# Patient Record
Sex: Female | Born: 1970 | Race: Black or African American | Hispanic: No | Marital: Single | Smoking: Never smoker
Health system: Southern US, Community
[De-identification: ages and names within clinical notes are randomized; demographics above are authoritative.]

## PROBLEM LIST (undated history)

## (undated) DIAGNOSIS — J189 Pneumonia, unspecified organism: Secondary | ICD-10-CM

## (undated) DIAGNOSIS — F319 Bipolar disorder, unspecified: Secondary | ICD-10-CM

## (undated) DIAGNOSIS — F4310 Post-traumatic stress disorder, unspecified: Secondary | ICD-10-CM

## (undated) DIAGNOSIS — U071 COVID-19: Secondary | ICD-10-CM

## (undated) DIAGNOSIS — F419 Anxiety disorder, unspecified: Secondary | ICD-10-CM

## (undated) HISTORY — PX: TONSILLECTOMY: SUR1361

---

## 2005-02-05 ENCOUNTER — Emergency Department: Payer: Self-pay | Admitting: Unknown Physician Specialty

## 2005-04-16 ENCOUNTER — Emergency Department: Payer: Self-pay | Admitting: Unknown Physician Specialty

## 2005-07-21 ENCOUNTER — Emergency Department: Payer: Self-pay | Admitting: Emergency Medicine

## 2008-02-09 ENCOUNTER — Inpatient Hospital Stay: Payer: Self-pay | Admitting: Psychiatry

## 2008-12-31 ENCOUNTER — Emergency Department: Payer: Self-pay | Admitting: Emergency Medicine

## 2009-06-06 ENCOUNTER — Emergency Department: Payer: Self-pay | Admitting: Emergency Medicine

## 2009-07-09 ENCOUNTER — Emergency Department: Payer: Self-pay | Admitting: Emergency Medicine

## 2009-10-01 ENCOUNTER — Inpatient Hospital Stay: Payer: Self-pay | Admitting: Psychiatry

## 2009-11-18 ENCOUNTER — Emergency Department: Payer: Self-pay | Admitting: Internal Medicine

## 2009-12-13 ENCOUNTER — Emergency Department: Payer: Self-pay | Admitting: Emergency Medicine

## 2010-01-20 ENCOUNTER — Emergency Department: Payer: Self-pay | Admitting: Emergency Medicine

## 2010-01-25 ENCOUNTER — Emergency Department: Payer: Self-pay | Admitting: Emergency Medicine

## 2010-06-05 ENCOUNTER — Emergency Department: Payer: Self-pay | Admitting: Emergency Medicine

## 2010-06-19 ENCOUNTER — Ambulatory Visit: Payer: Self-pay | Admitting: Unknown Physician Specialty

## 2010-06-24 ENCOUNTER — Inpatient Hospital Stay: Payer: Self-pay | Admitting: Internal Medicine

## 2010-08-20 ENCOUNTER — Emergency Department: Payer: Self-pay | Admitting: Emergency Medicine

## 2010-10-14 ENCOUNTER — Ambulatory Visit: Payer: Self-pay | Admitting: Family Medicine

## 2011-08-14 IMAGING — CT CT CHEST W/ CM
2 series · 15 of 31 positions shown, 19 images · non-contrast
Comparison: none

REASON FOR EXAM: hypoxia, tachycardia, chest pain, elevated d-dimer, ?PE
COMMENTS:

PROCEDURE:     CT  - CT CHEST (FOR PE) W  - June 24, 2010  [DATE]
RESULT:     Comparison: Chest radiograph performed same day.
TECHNIQUE: Multiple axial images were obtained of the chest, after the
administration of 85 mL Ssovue-U8Q, according to the PE protocol.

[Series 4: soft tissue · axial · 0.62mm/px · z∈[-1092,-1046]mm · 2 of 97 slices shown]
[im 8/97  mediastinal]
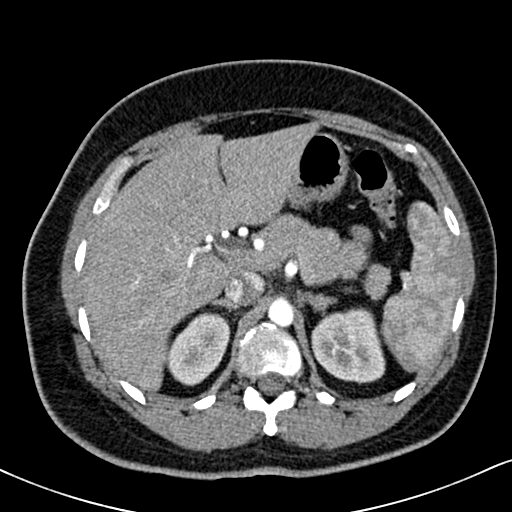
[im 23/97  mediastinal]
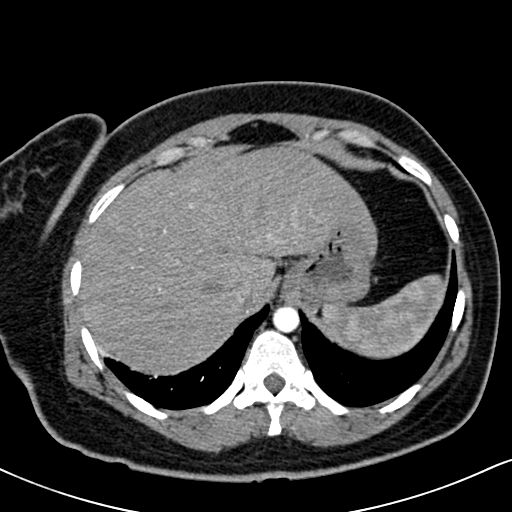

[Series 5: lung windows · axial · 0.62mm/px · z∈[-1082,-848]mm · 13 of 94 slices shown, 17 images]
[im 8/94  mediastinal]
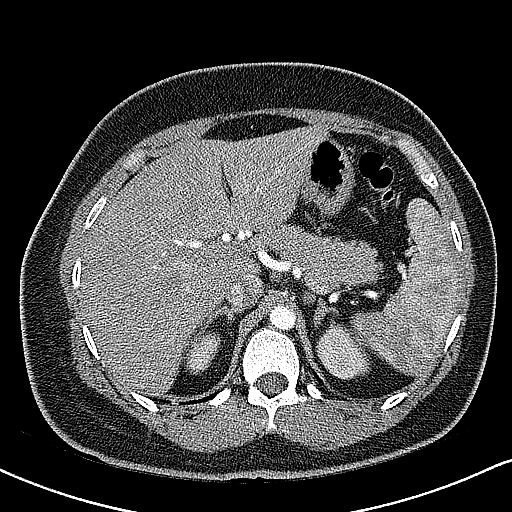
[im 8/94  lung]
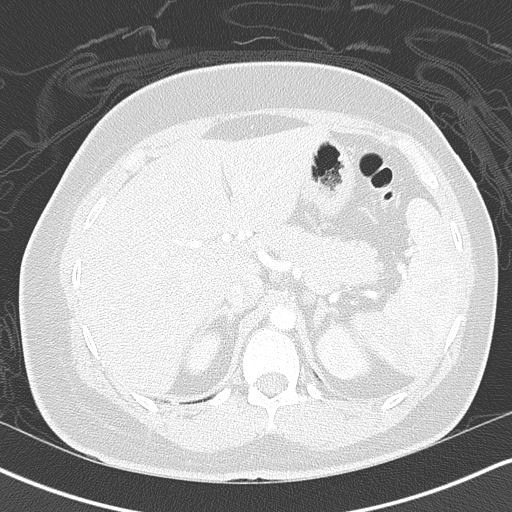
[im 15/94  lung]
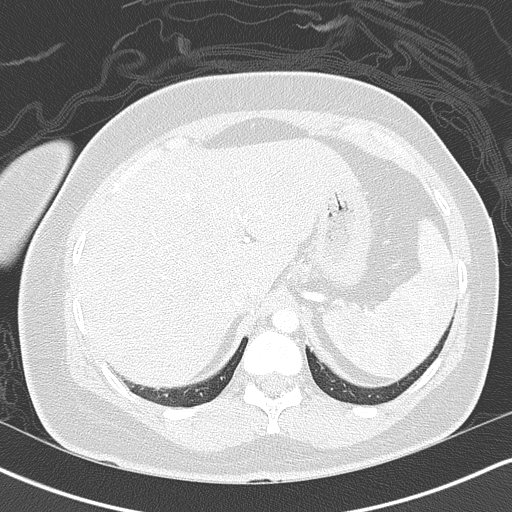
[im 22/94  lung]
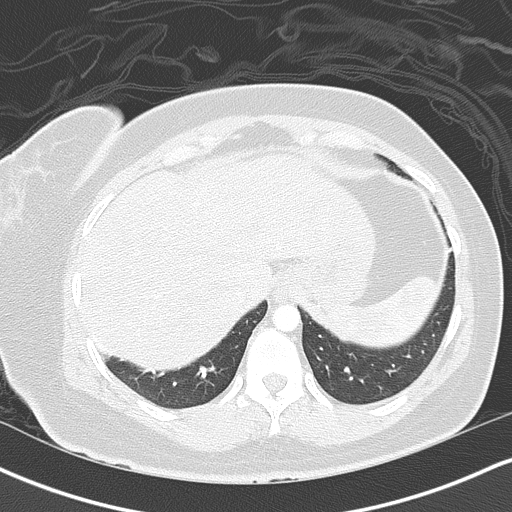
[im 29/94  lung]
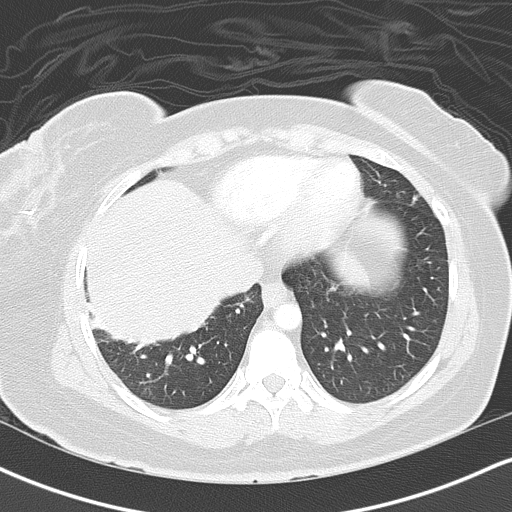
[im 36/94  mediastinal]
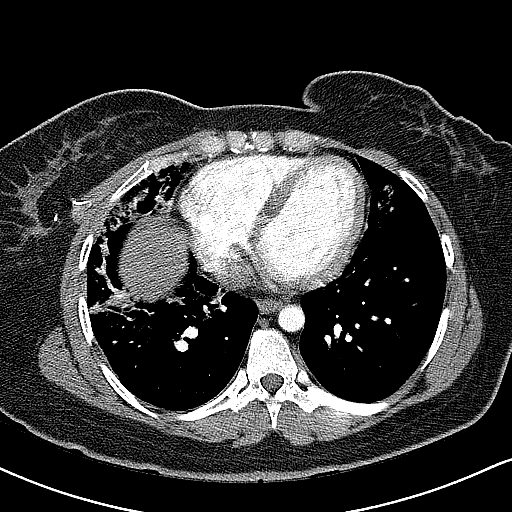
[im 36/94  lung]
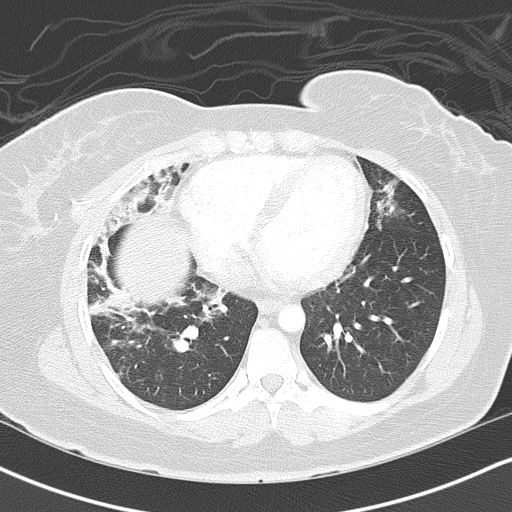
[im 43/94  lung]
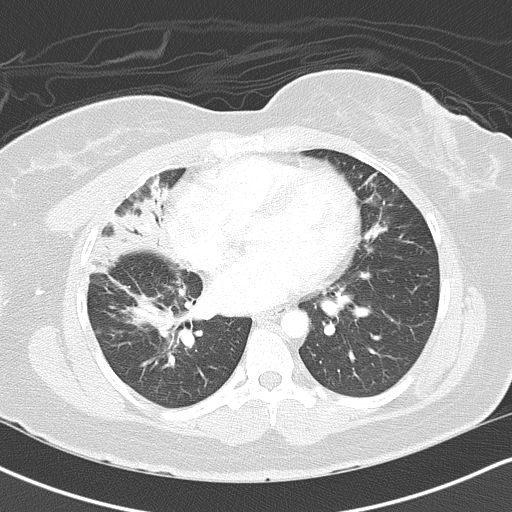
[im 47/94  lung]
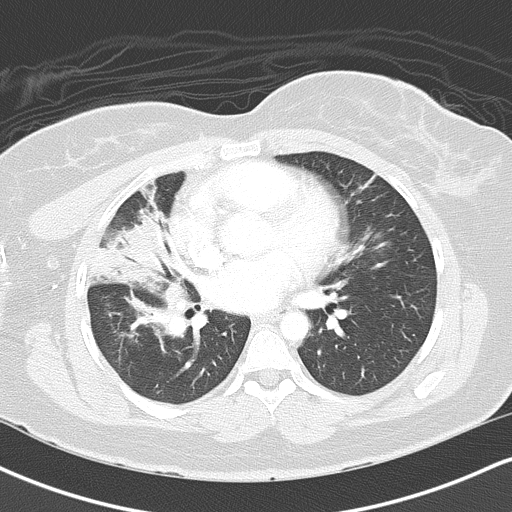
[im 51/94  lung]
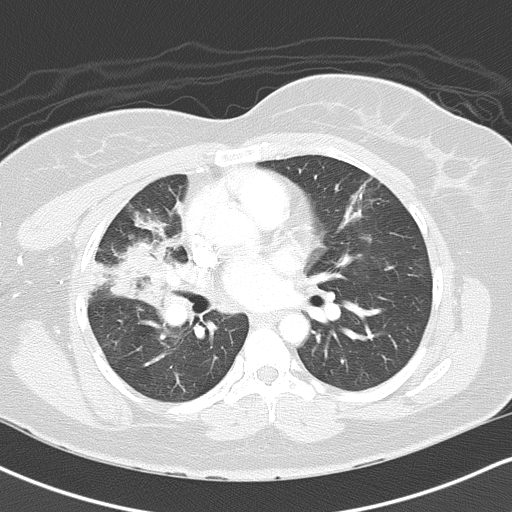
[im 58/94  mediastinal]
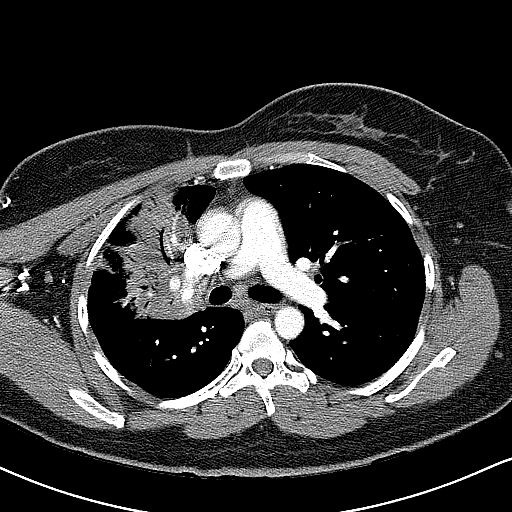
[im 58/94  lung]
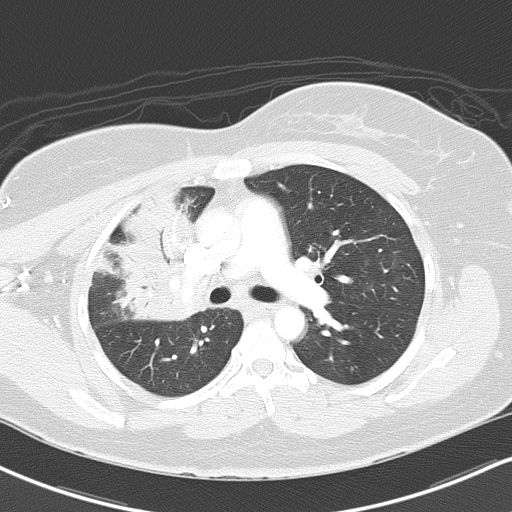
[im 65/94  lung]
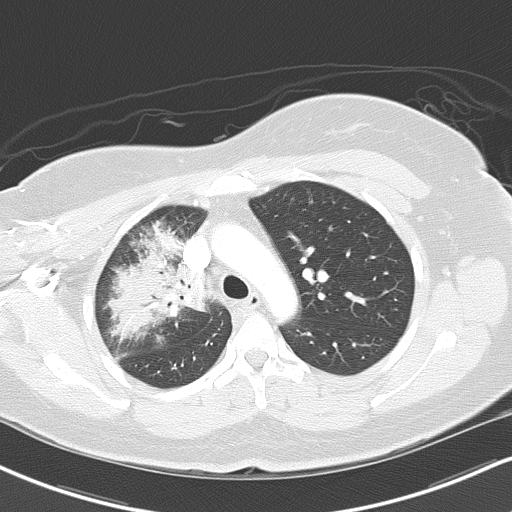
[im 72/94  lung]
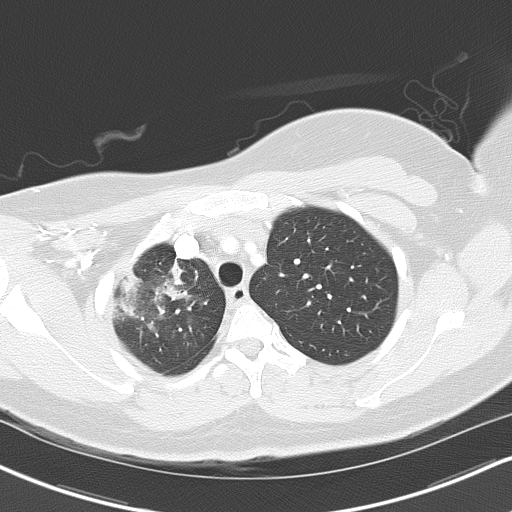
[im 79/94  lung]
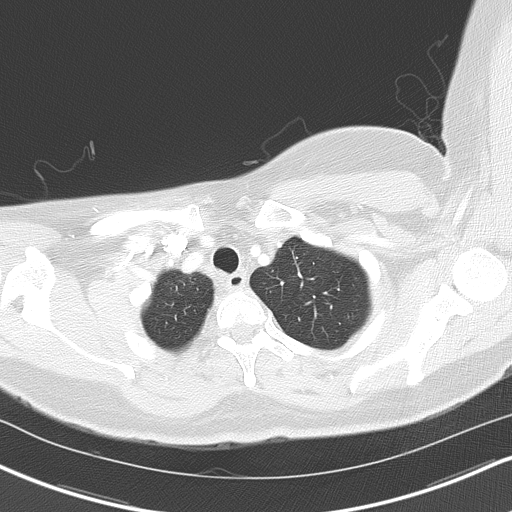
[im 86/94  mediastinal]
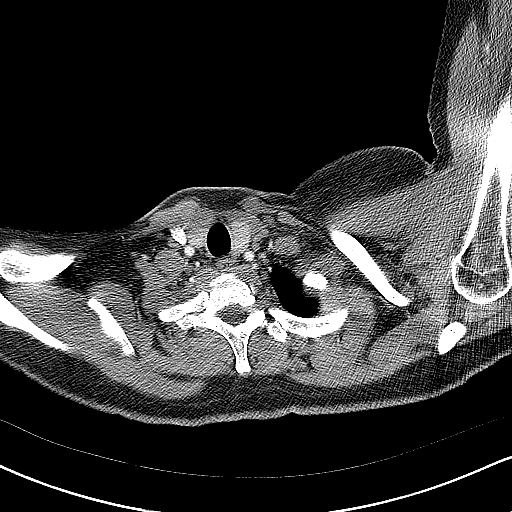
[im 86/94  lung]
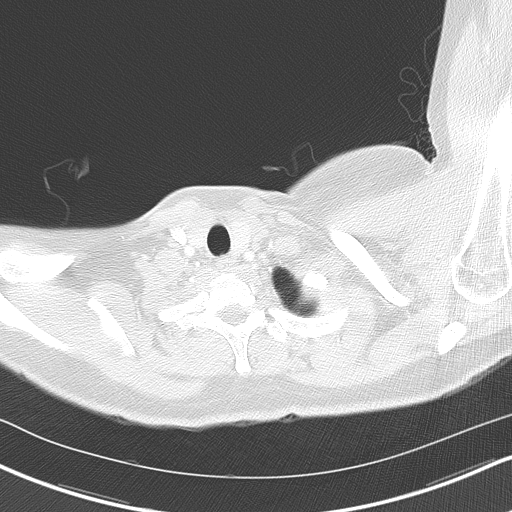

[15 of 31 positions shown; findings below may reference images not displayed]

FINDINGS: No mediastinal, hilar, or axillary lymphadenopathy identified.

The thoracic aorta is normal in caliber. No pulmonary embolus identified.

There is a large amount of heterogeneous opacification in the right upper
lobe, right middle lobe, and to a lesser extent the right lower lobe.
Additionally, mild heterogeneous opacities are seen in the lingula and left
upper lobe. The central airways are patent.

No aggressive lytic or sclerotic osseous lesions identified.
IMPRESSION: 1. No pulmonary embolus identified.
2. Bilateral heterogeneous opacities, right greater than left, concerning
for multifocal pneumonia. Recommend followup with chest radiographs to
ensure resolution.

## 2011-08-27 ENCOUNTER — Emergency Department: Payer: Self-pay | Admitting: Emergency Medicine

## 2011-12-29 ENCOUNTER — Emergency Department: Payer: Self-pay | Admitting: Emergency Medicine

## 2012-10-10 ENCOUNTER — Emergency Department: Payer: Self-pay | Admitting: Emergency Medicine

## 2012-10-26 ENCOUNTER — Emergency Department: Payer: Self-pay | Admitting: Emergency Medicine

## 2013-02-21 ENCOUNTER — Emergency Department: Payer: Self-pay | Admitting: Emergency Medicine

## 2013-02-21 LAB — URINALYSIS, COMPLETE
Bilirubin,UR: NEGATIVE
Glucose,UR: NEGATIVE mg/dL (ref 0–75)
Ketone: NEGATIVE
Ph: 6 (ref 4.5–8.0)
Protein: 100
RBC,UR: 12 /HPF (ref 0–5)
Specific Gravity: 1.026 (ref 1.003–1.030)
Squamous Epithelial: 20

## 2013-02-21 LAB — PREGNANCY, URINE: Pregnancy Test, Urine: NEGATIVE m[IU]/mL

## 2013-02-21 LAB — WET PREP, GENITAL

## 2013-05-18 ENCOUNTER — Emergency Department: Payer: Self-pay | Admitting: Emergency Medicine

## 2013-05-18 LAB — BASIC METABOLIC PANEL
Anion Gap: 8 (ref 7–16)
BUN: 6 mg/dL — ABNORMAL LOW (ref 7–18)
Calcium, Total: 8.5 mg/dL (ref 8.5–10.1)
Co2: 24 mmol/L (ref 21–32)
EGFR (African American): >60
Glucose: 118 mg/dL — ABNORMAL HIGH (ref 65–99)
Osmolality: 282 (ref 275–301)
Potassium: 3.9 mmol/L (ref 3.5–5.1)

## 2013-05-18 LAB — CBC
HGB: 14.7 g/dL (ref 12.0–16.0)
MCV: 91 fL (ref 80–100)
Platelet: 232 10*3/uL (ref 150–440)
RBC: 4.66 10*6/uL (ref 3.80–5.20)
WBC: 6.5 10*3/uL (ref 3.6–11.0)

## 2013-05-18 LAB — CK TOTAL AND CKMB (NOT AT ARMC)
CK, Total: 129 U/L (ref 21–215)
CK-MB: <0.5 ng/mL — ABNORMAL LOW (ref 0.5–3.6)

## 2013-05-18 LAB — TROPONIN I: Troponin-I: <0.02 ng/mL

## 2013-11-30 IMAGING — CR DG CHEST 2V
1 series · 2 of 2 positions shown · non-contrast
Comparison: none

REASON FOR EXAM: cough, congestion
COMMENTS:

PROCEDURE:     DXR - DXR CHEST PA (OR AP) AND LATERAL  - October 10, 2012  [DATE]
RESULT:     The lungs are clear. The cardiac silhouette and visualized bony
skeleton are unremarkable.

[Series 1: w chest pa · 0.14mm/px · 2 of 2 slices shown]
[im 1/2]
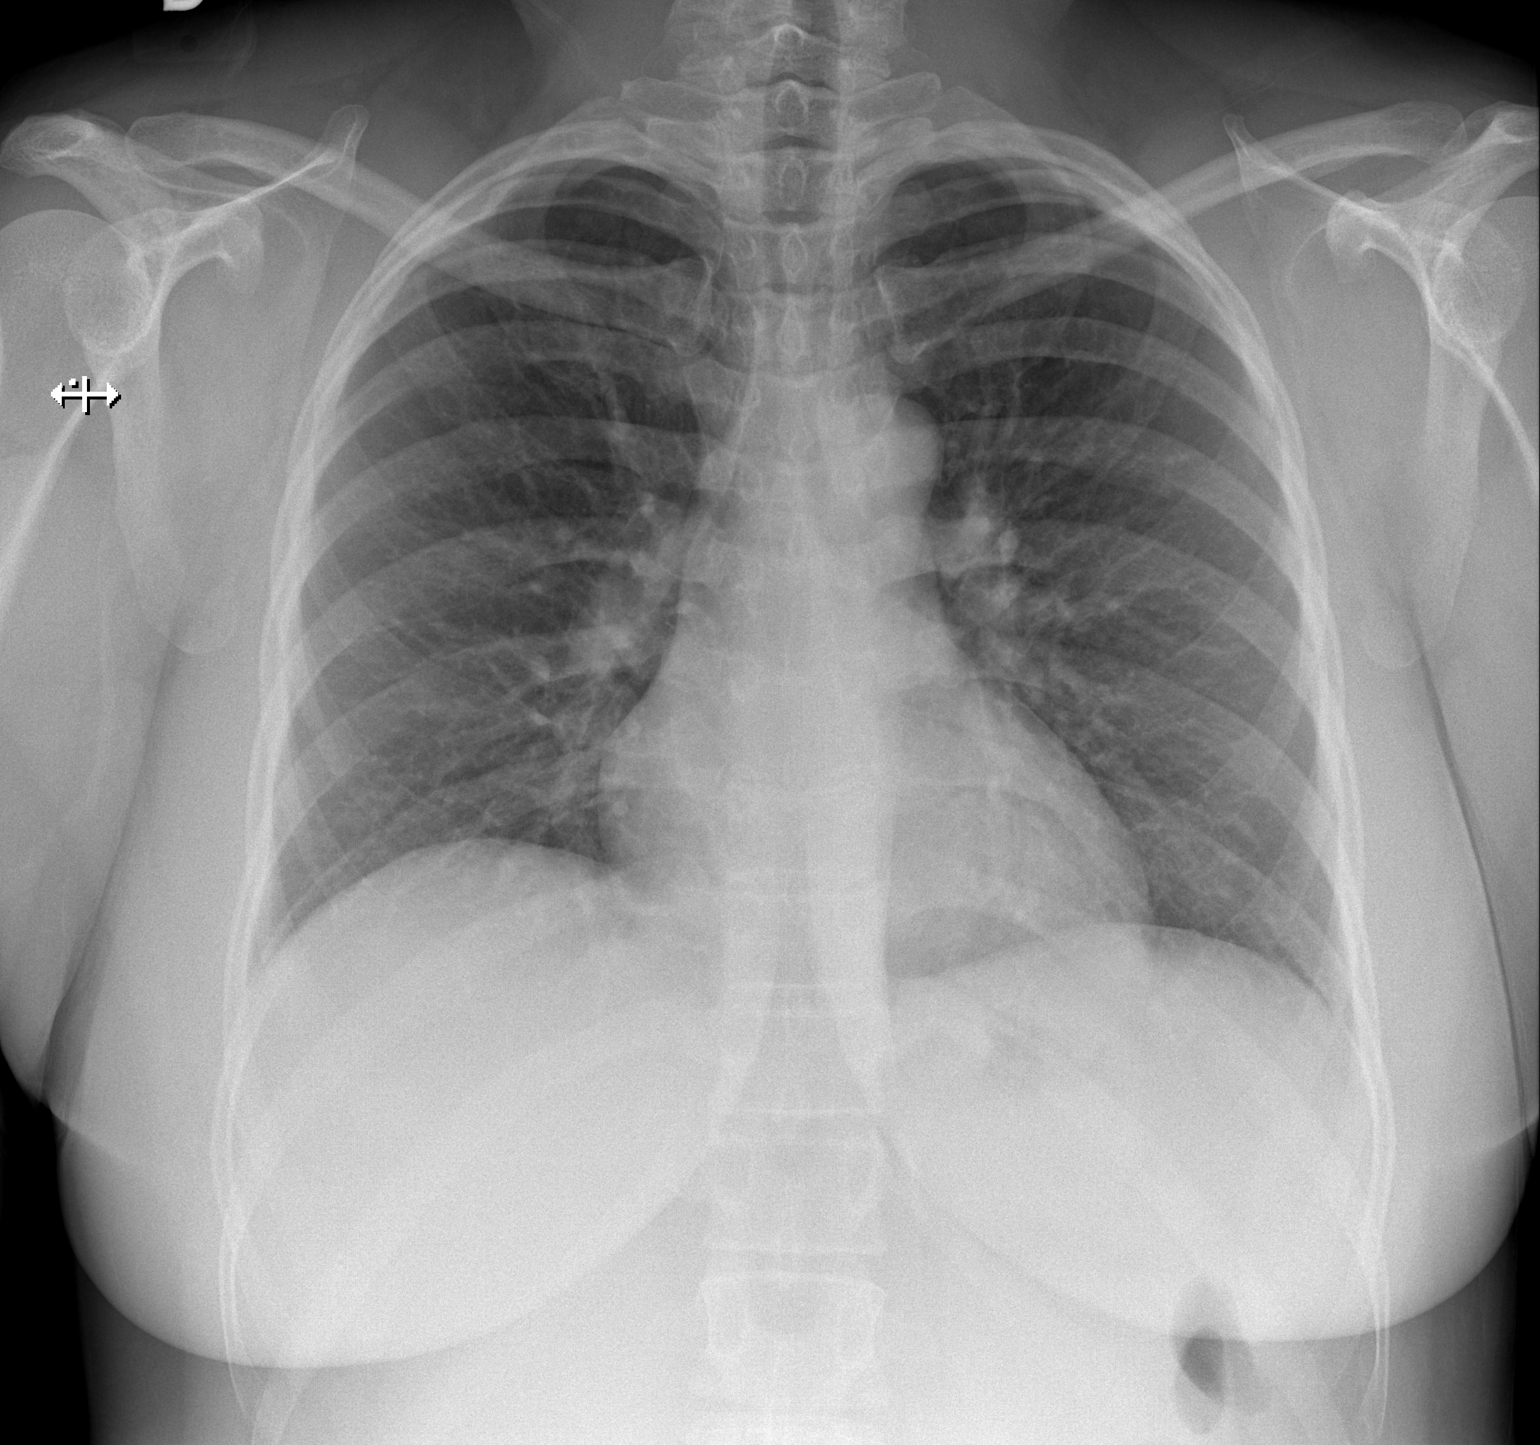
[im 2/2]
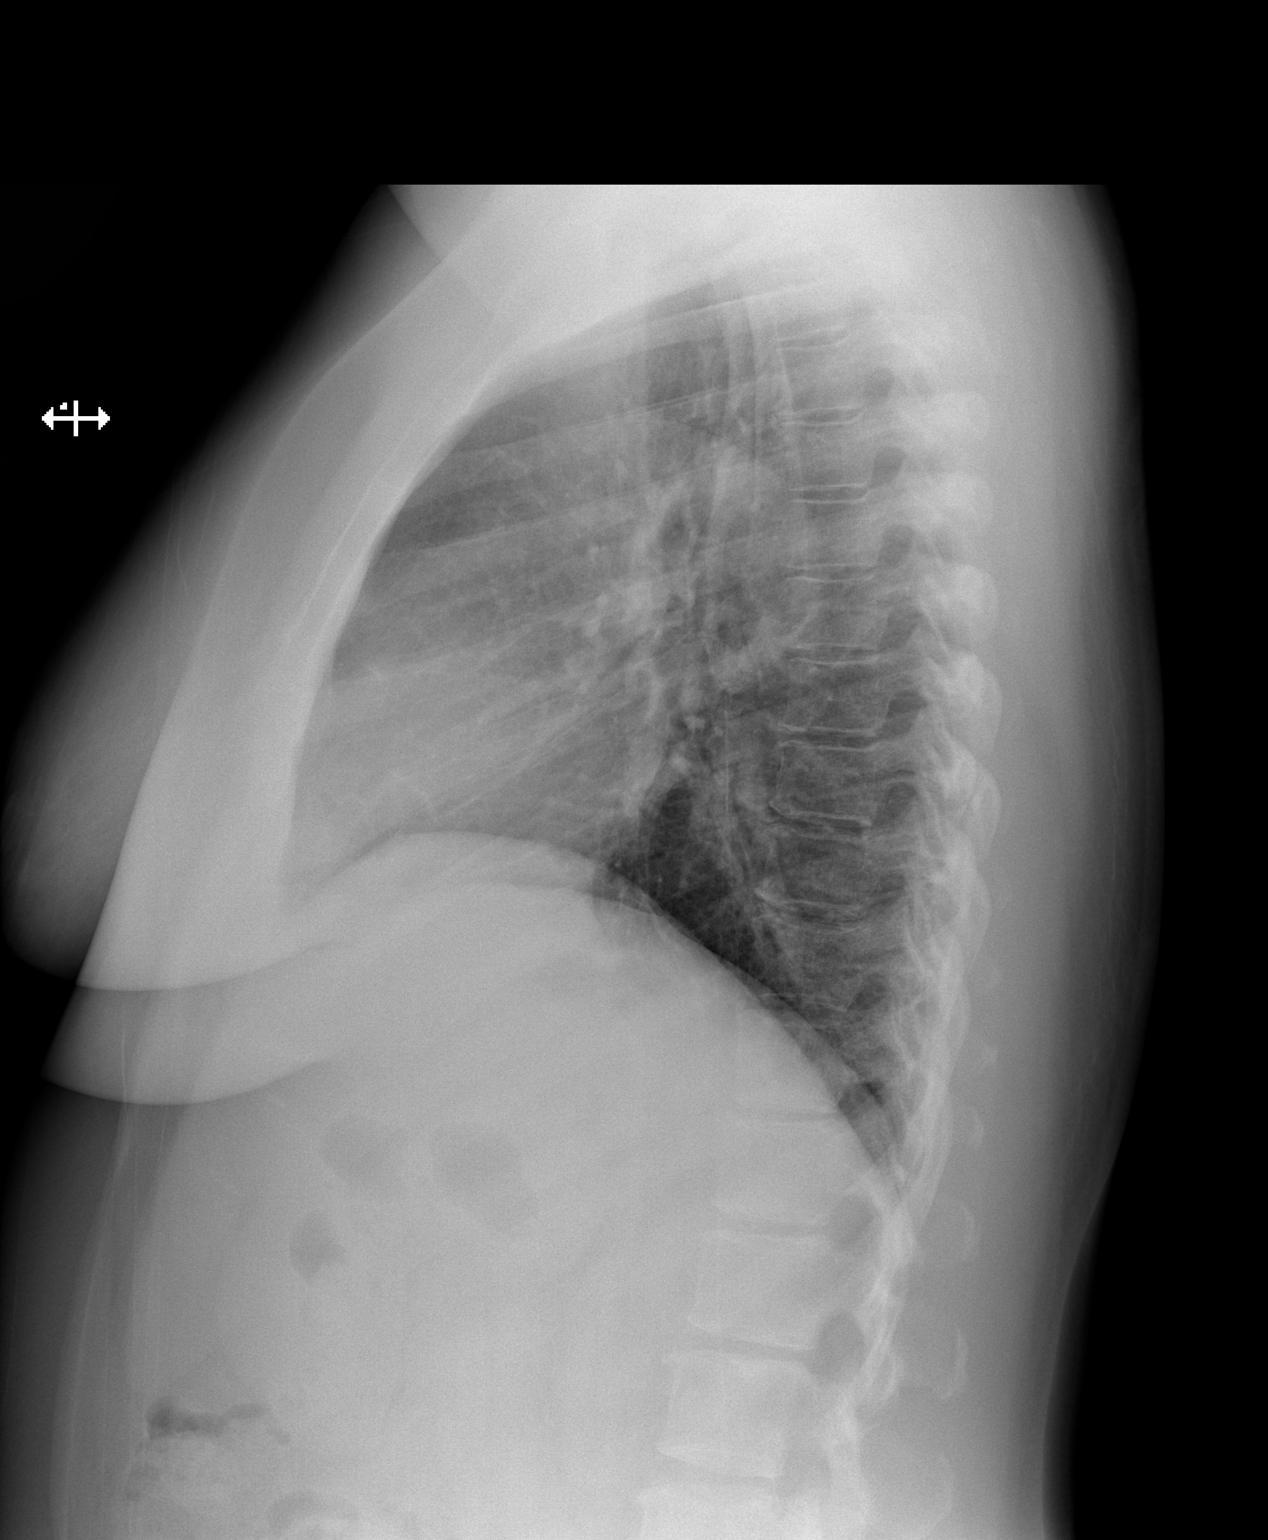

[2 of 2 positions shown; findings below may reference images not displayed]

IMPRESSION: 1. Chest radiograph without evidence of acute cardiopulmonary disease.
2. Comparison made to prior study dated 12/29/2011.

## 2014-05-23 ENCOUNTER — Emergency Department: Payer: Self-pay | Admitting: Emergency Medicine

## 2014-05-23 LAB — CBC WITH DIFFERENTIAL/PLATELET
BASOS ABS: 0 10*3/uL (ref 0.0–0.1)
Basophil %: 0.2 %
EOS ABS: 0.1 10*3/uL (ref 0.0–0.7)
Eosinophil %: 1.7 %
HCT: 45.9 % (ref 35.0–47.0)
HGB: 15.3 g/dL (ref 12.0–16.0)
Lymphocyte #: 1 10*3/uL (ref 1.0–3.6)
Lymphocyte %: 14 %
MCH: 30.5 pg (ref 26.0–34.0)
MCHC: 33.4 g/dL (ref 32.0–36.0)
MCV: 91 fL (ref 80–100)
MONO ABS: 0.3 x10 3/mm (ref 0.2–0.9)
MONOS PCT: 3.8 %
NEUTROS ABS: 5.7 10*3/uL (ref 1.4–6.5)
Neutrophil %: 80.3 %
PLATELETS: 220 10*3/uL (ref 150–440)
RBC: 5.03 10*6/uL (ref 3.80–5.20)
RDW: 13.2 % (ref 11.5–14.5)
WBC: 7.1 10*3/uL (ref 3.6–11.0)

## 2014-05-23 LAB — COMPREHENSIVE METABOLIC PANEL
ALBUMIN: 4.1 g/dL (ref 3.4–5.0)
Alkaline Phosphatase: 68 U/L
Anion Gap: 9 (ref 7–16)
BILIRUBIN TOTAL: 1.1 mg/dL — AB (ref 0.2–1.0)
BUN: 8 mg/dL (ref 7–18)
CHLORIDE: 105 mmol/L (ref 98–107)
CREATININE: 0.89 mg/dL (ref 0.60–1.30)
Calcium, Total: 9 mg/dL (ref 8.5–10.1)
Co2: 22 mmol/L (ref 21–32)
EGFR (African American): >60
EGFR (Non-African Amer.): >60
Glucose: 140 mg/dL — ABNORMAL HIGH (ref 65–99)
OSMOLALITY: 273 (ref 275–301)
POTASSIUM: 3.4 mmol/L — AB (ref 3.5–5.1)
SGOT(AST): 82 U/L — ABNORMAL HIGH (ref 15–37)
SGPT (ALT): 54 U/L (ref 12–78)
SODIUM: 136 mmol/L (ref 136–145)
TOTAL PROTEIN: 8.2 g/dL (ref 6.4–8.2)

## 2014-05-23 LAB — LIPASE, BLOOD: Lipase: 161 U/L (ref 73–393)

## 2014-11-11 ENCOUNTER — Emergency Department: Payer: Self-pay | Admitting: Emergency Medicine

## 2016-07-31 ENCOUNTER — Other Ambulatory Visit: Payer: Self-pay | Admitting: Primary Care

## 2016-07-31 DIAGNOSIS — N938 Other specified abnormal uterine and vaginal bleeding: Secondary | ICD-10-CM

## 2016-08-06 ENCOUNTER — Ambulatory Visit: Payer: Medicaid Other

## 2016-08-11 ENCOUNTER — Ambulatory Visit
Admission: RE | Admit: 2016-08-11 | Discharge: 2016-08-11 | Disposition: A | Discharge status: Home / Self Care | Payer: Medicaid Other | Source: Ambulatory Visit | Attending: Primary Care | Admitting: Primary Care

## 2016-08-11 DIAGNOSIS — N938 Other specified abnormal uterine and vaginal bleeding: Secondary | ICD-10-CM | POA: Insufficient documentation

## 2017-04-09 IMAGING — US US TRANSVAGINAL NON-OB
1 series · 13 of 25 positions shown · non-contrast
Comparison: None

CLINICAL DATA: Dysfunctional uterine bleeding for 1 month. LMP
07/15/2016.

EXAM:
TRANSABDOMINAL AND TRANSVAGINAL ULTRASOUND OF PELVIS
TECHNIQUE: Both transabdominal and transvaginal ultrasound examinations of the
pelvis were performed. Transabdominal technique was performed for
global imaging of the pelvis including uterus, ovaries, adnexal
regions, and pelvic cul-de-sac. It was necessary to proceed with
endovaginal exam following the transabdominal exam to visualize the
endometrium and ovaries.

[Series 1: us transvaginal non-ob · 0.24mm/px · 13 of 181 slices shown]
[im 1/181]
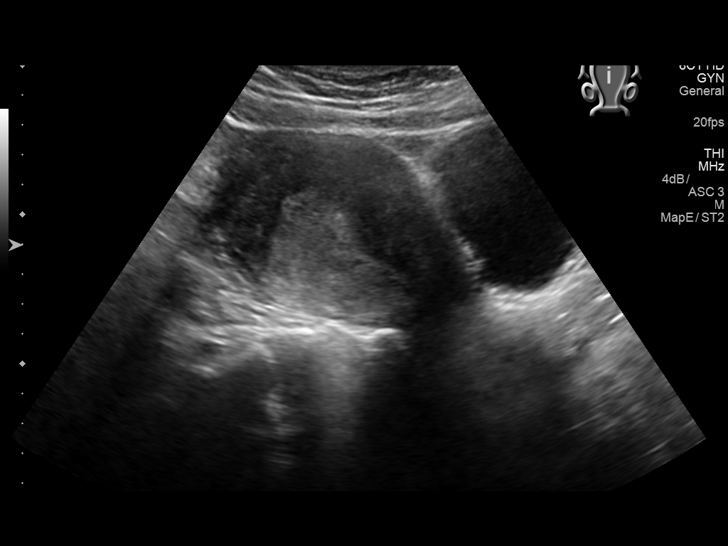
[im 16/181]
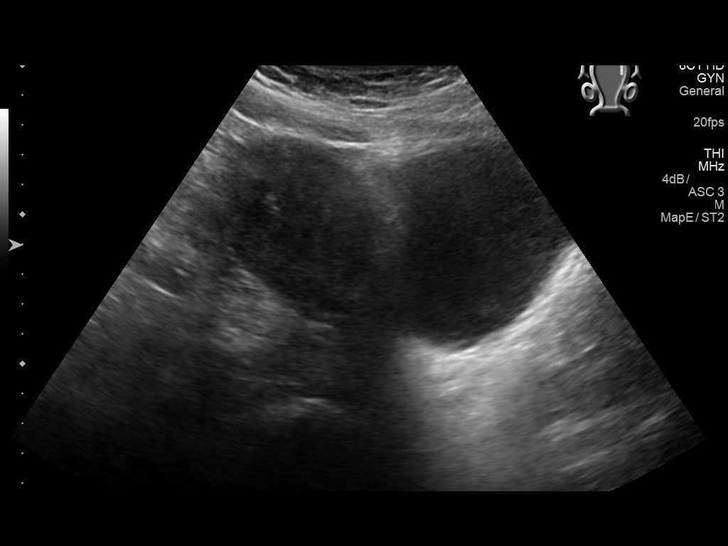
[im 31/181]
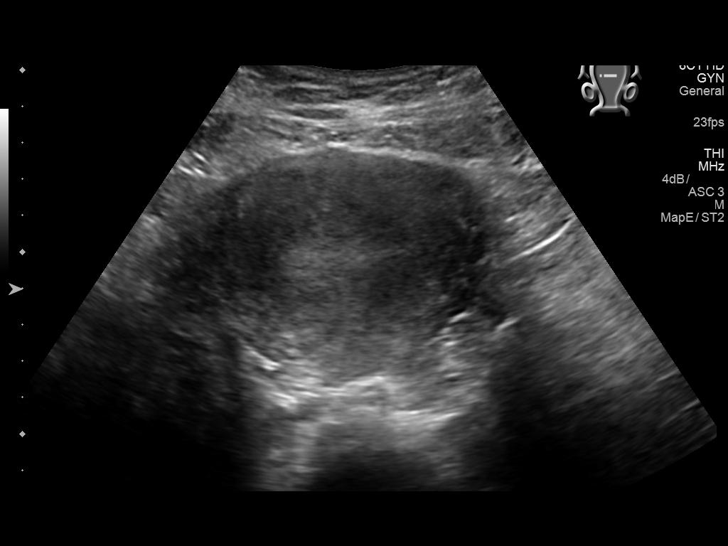
[im 46/181]
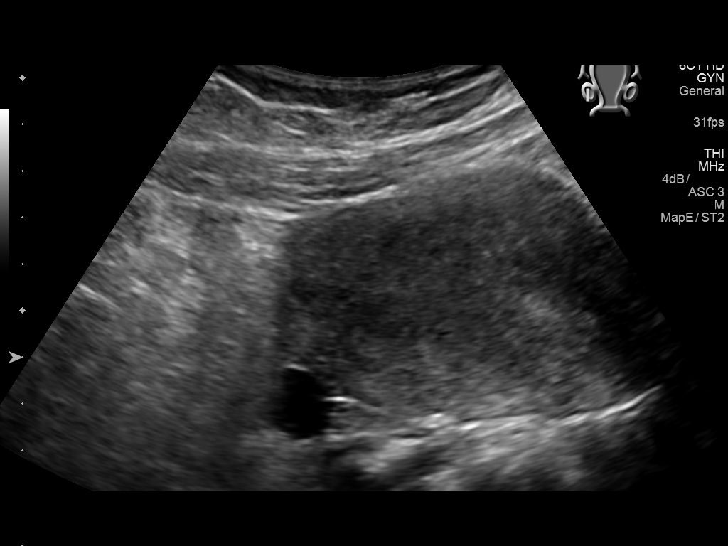
[im 61/181]
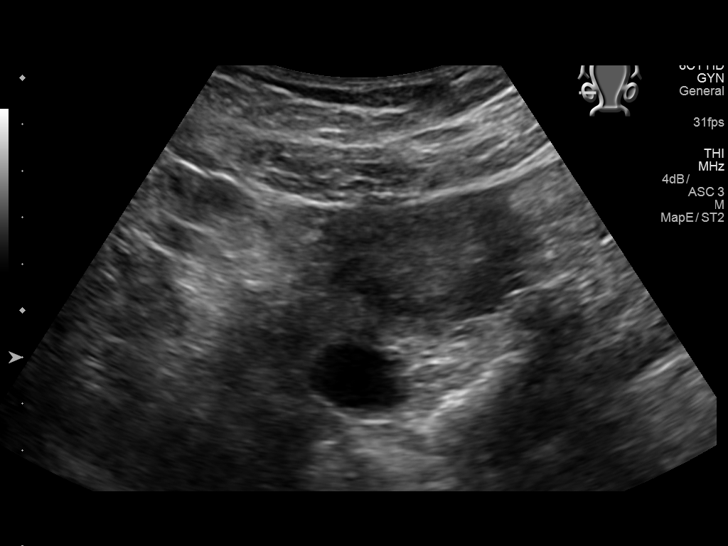
[im 76/181]
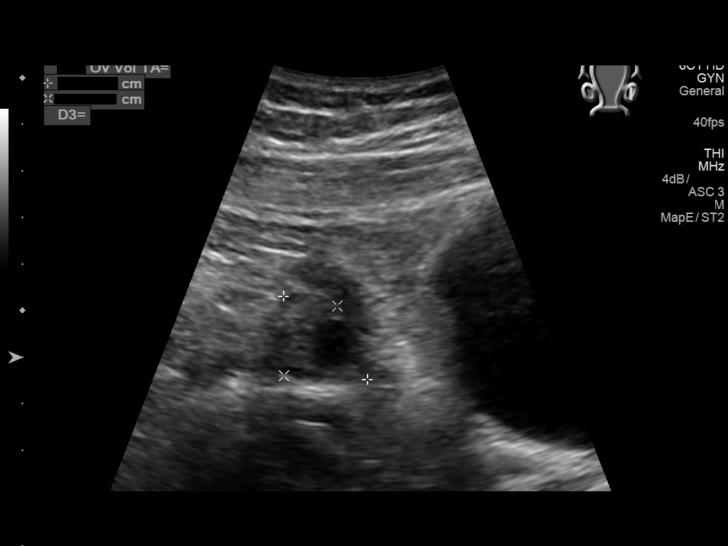
[im 91/181]
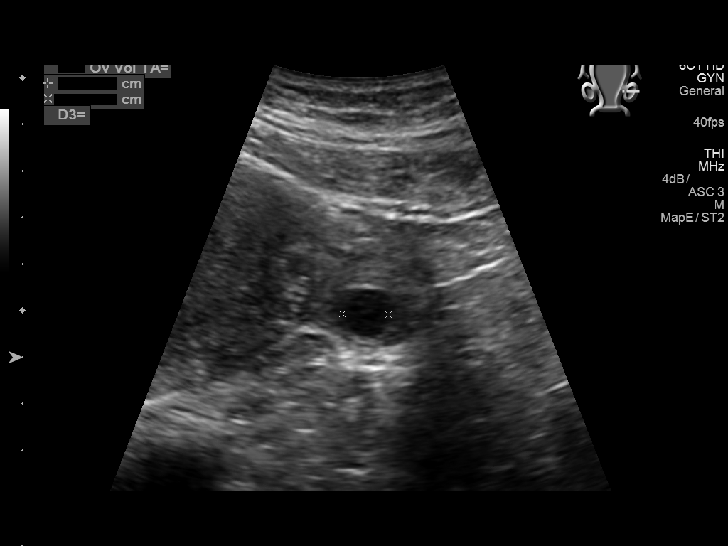
[im 106/181]
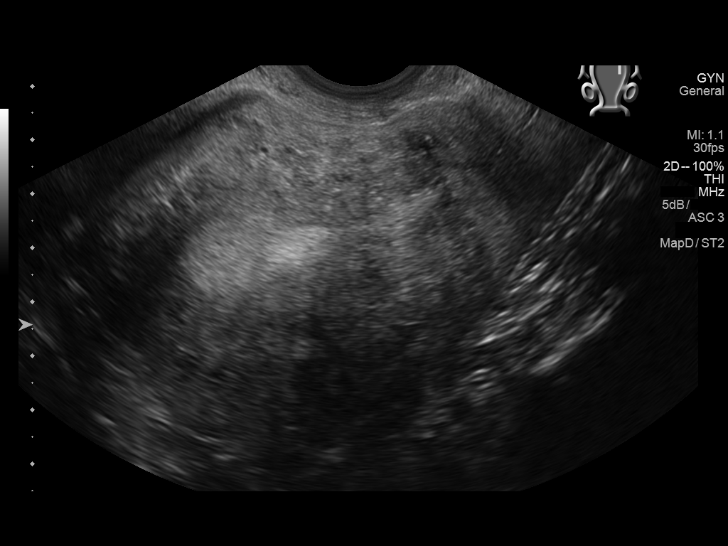
[im 121/181]
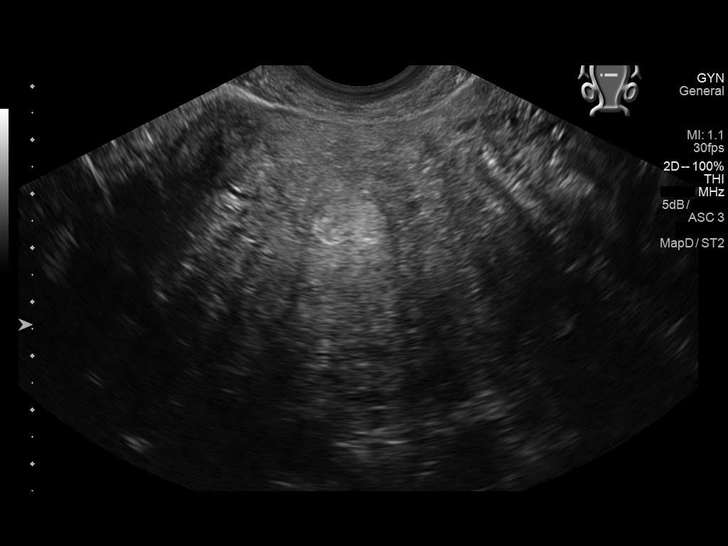
[im 136/181]
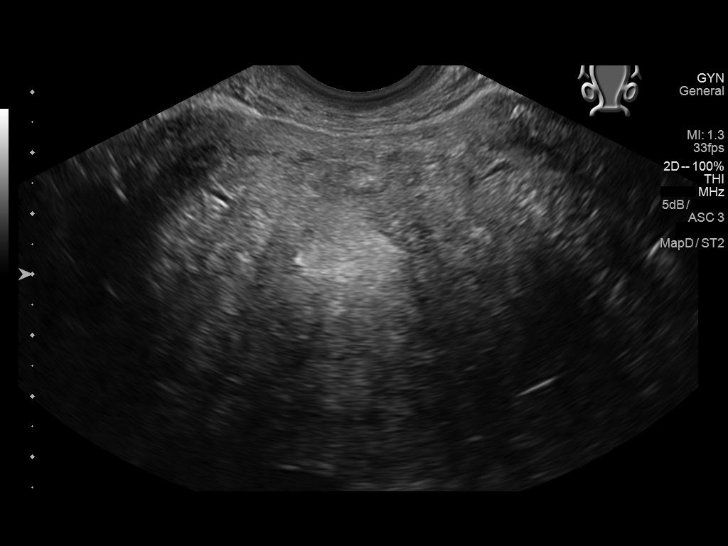
[im 151/181]
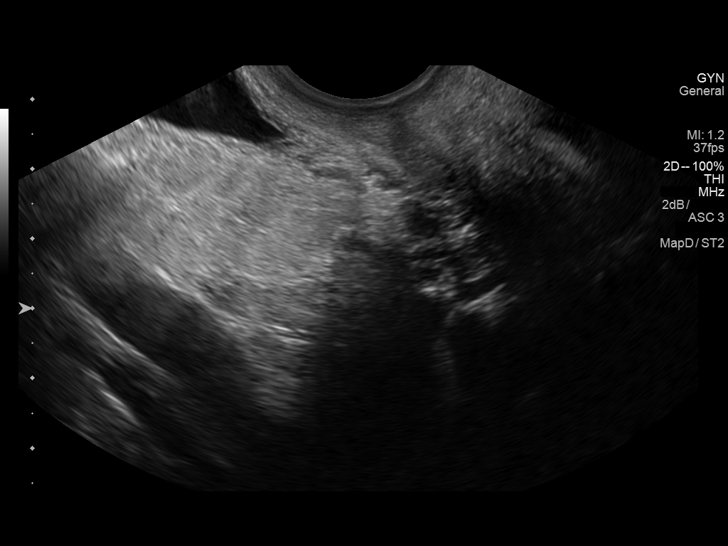
[im 166/181]
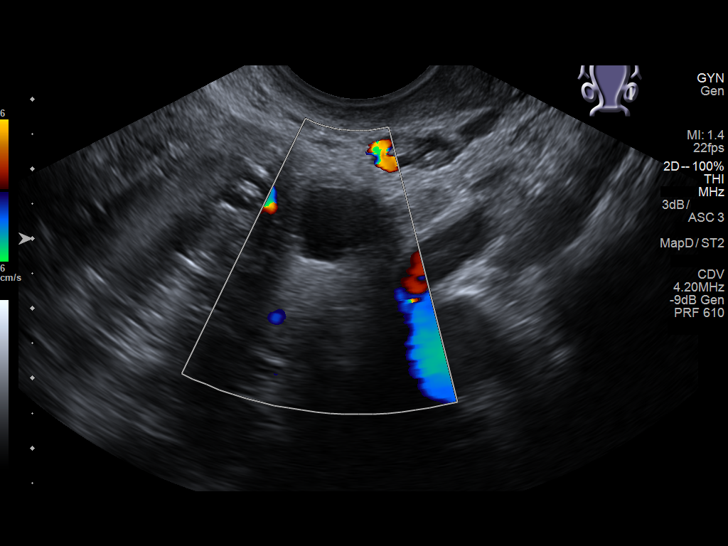
[im 181/181]
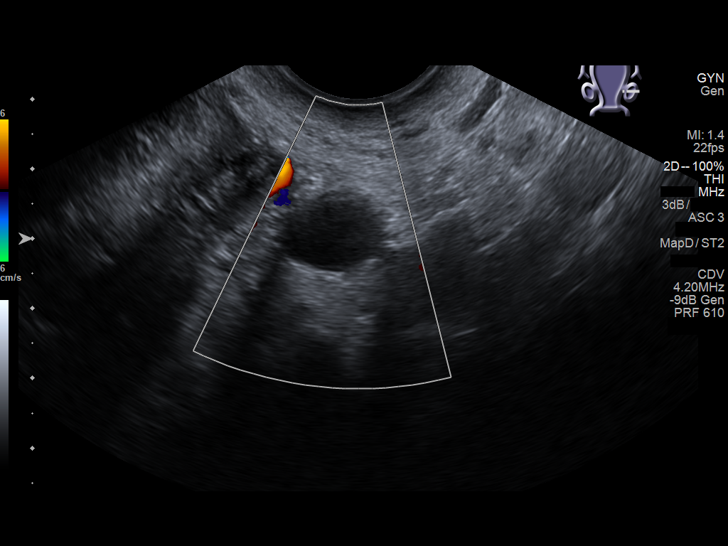

[13 of 25 positions shown; findings below may reference images not displayed]

FINDINGS: Uterus

Measurements: 10.1 x 5.4 x 8.3 cm. Heterogeneous echogenicity of
myometrium noted, however no distinct fibroids are identified.
Several cervical nabothian cysts noted.

Endometrium

Thickness: 12 mm.  No focal abnormality visualized.

Right ovary

Measurements: 2.0 x 1.5 x 2.1 cm. Normal appearance/no adnexal mass.

Left ovary

Measurements: 2.8 x 1.8 x 1.8 cm. Normal appearance/no adnexal mass.

Other findings

No abnormal free fluid.
IMPRESSION: No fibroids identified. Endometrial thickness measures 12 mm. If
bleeding remains unresponsive to hormonal or medical therapy,
sonohysterogram should be considered for focal lesion work-up. (Ref:
Radiological Reasoning: Algorithmic Workup of Abnormal Vaginal
Bleeding with Endovaginal Sonography and Sonohysterography. AJR
[id number]; 191:S68-73).

Normal appearance both ovaries.  No adnexal mass identified.

## 2019-06-06 ENCOUNTER — Emergency Department
Admission: EM | Admit: 2019-06-06 | Discharge: 2019-06-06 | Disposition: A | Discharge status: Home / Self Care | Payer: Medicaid Other | Attending: Emergency Medicine | Admitting: Emergency Medicine

## 2019-06-06 ENCOUNTER — Other Ambulatory Visit: Payer: Self-pay

## 2019-06-06 DIAGNOSIS — G5603 Carpal tunnel syndrome, bilateral upper limbs: Secondary | ICD-10-CM | POA: Insufficient documentation

## 2019-06-06 DIAGNOSIS — R202 Paresthesia of skin: Secondary | ICD-10-CM | POA: Diagnosis present

## 2019-06-06 DIAGNOSIS — F411 Generalized anxiety disorder: Secondary | ICD-10-CM | POA: Insufficient documentation

## 2019-06-06 HISTORY — DX: Bipolar disorder, unspecified: F31.9

## 2019-06-06 HISTORY — DX: Anxiety disorder, unspecified: F41.9

## 2019-06-06 HISTORY — DX: Post-traumatic stress disorder, unspecified: F43.10

## 2019-06-06 MED ORDER — PAROXETINE HCL 10 MG PO TABS: 10.0000 mg | ORAL_TABLET | Freq: Every day | ORAL | 2 refills | Status: AC

## 2019-06-06 MED ORDER — MELOXICAM 15 MG PO TABS
15.0000 mg | ORAL_TABLET | Freq: Every day | ORAL | 0 refills | Status: AC
Start: 2019-06-06 — End: ?

## 2019-06-06 NOTE — ED Notes (Signed)
Signature pad in room not working. Patient verbalized understanding of discharge instructions and follow-up care. Ambulatory to lobby with steady gait and NAD noted. 

## 2019-06-06 NOTE — ED Notes (Signed)
See triage note  Presents with some anxiety   Also having some numbness to right hand and also having some discomfort in neck with movement

## 2019-06-06 NOTE — ED Triage Notes (Signed)
Pt reports anxiety x3 months and would like to be treated  Pt reports right ring finger/thumb numb and right arm pain x3 weeks

## 2019-06-06 NOTE — ED Provider Notes (Signed)
[facility] Emergency Department Provider Note  ____________________________________________  Time seen: Approximately 3:46 PM  I have reviewed the triage vital signs and the nursing notes.   HISTORY  Chief Complaint Arm Pain and Anxiety    HPI Helen Lopez is a 48 y.o. female who presents the emergency department with 2 complaints.  Patient's primary complaint is numbness, tingling, pain in the digits of bilateral wrist.  Patient reports that she has had carpal tunnel in the past on the right side, symptoms feel consistent with same.  Patient has had no trauma to either hand.  Patient reports that sometimes she has some neck stiffness in the morning but this resolves throughout the day.  She attributes this to sleeping position.  She does not have any chronic neck pain.  No loss of range of motion to either upper extremity.  No headaches, fevers or chills, chest pain, shortness of breath.  Patient reports in the past she has been on medication which helped her carpal tunnel and is requesting medications for same.  Patient is also endorsing generalized anxiety type symptoms over the past several months.  Patient reports that she has a history of bipolar disorder but does not take any medications on a regular basis.  She does have a psychiatrist and states that she is going to follow-up with them within the next few weeks as soon as she can get an appointment.  Patient is requesting medication for anxiety.  She denies any depression or mania at this time.  Patient reports that in the past she has had thoughts that the world would be better without her, but she has never had any suicidal ideation or attempts.  Patient denies any at this time.  Patient reports that she knows both her psychiatrist as well as emergency department can help her if her symptoms increase to the point of having any suicidal or homicidal ideations.  Patient reports that with COVID-19, economic  situation, road events that her anxiety has been increased over the past 3 months from this.         Past Medical History:  Diagnosis Date  . Anxiety   . Bipolar 1 disorder ([place])   . PTSD (post-traumatic stress disorder)     There are no active problems to display for this patient.   Past Surgical History:  Procedure Laterality Date  . TONSILLECTOMY      Prior to Admission medications   Medication Sig Start Date End Date Taking? Authorizing Provider  meloxicam (MOBIC) 15 MG tablet Take 1 tablet (15 mg total) by mouth daily. 06/06/19   Kaivon Livesey, Charline Bills, PA-C  PARoxetine (PAXIL) 10 MG tablet Take 1 tablet (10 mg total) by mouth daily. 06/06/19 06/05/20  Deeric Cruise, Charline Bills, PA-C    Allergies Patient has no known allergies.  No family history on file.  Social History Social History   Tobacco Use  . Smoking status: Never Smoker  . Smokeless tobacco: Never Used  Substance Use Topics  . Alcohol use: Yes    Alcohol/week: 2.0 standard drinks    Types: 2 Glasses of wine per week  . Drug use: Yes    Types: Marijuana    Comment: last used yesterday     Review of Systems  Constitutional: No fever/chills Eyes: No visual changes. No discharge ENT: No upper respiratory complaints. Cardiovascular: no chest pain. Respiratory: no cough. No SOB. Gastrointestinal: No abdominal pain.  No nausea, no vomiting.  No diarrhea.  No constipation. Musculoskeletal:  Positive for numbness and tingling in bilateral hands/fingers Skin: Negative for rash, abrasions, lacerations, ecchymosis. Neurological: Negative for headaches, focal weakness or numbness. Psychological: Positive for generalized anxiety but denies any suicidal or homicidal ideations. 10-point ROS otherwise negative.  ____________________________________________   PHYSICAL EXAM:  VITAL SIGNS: ED Triage Vitals  Enc Vitals Group     BP 06/06/19 1541 131/82     Pulse Rate 06/06/19 1541 86     Resp 06/06/19 1541 16      Temp 06/06/19 1541 98.8 F (37.1 C)     Temp Source 06/06/19 1541 Oral     SpO2 06/06/19 1541 97 %     Weight 06/06/19 1536 165 lb (74.8 kg)     Height 06/06/19 1536 5\' 3"  (1.6 m)     Head Circumference --      Peak Flow --      Pain Score 06/06/19 1536 10     Pain Loc --      Pain Edu? --      Excl. in GC? --      Constitutional: Alert and oriented. Well appearing and in no acute distress. Eyes: Conjunctivae are normal. PERRL. EOMI. Head: Atraumatic. ENT:      Ears:       Nose: No congestion/rhinnorhea.      Mouth/Throat: Mucous membranes are moist.  Neck: No stridor.  No cervical spine tenderness to palpation.  No palpable abnormality or step-off.  Radial pulse intact bilateral upper extremities.  Sensation intact in all dermatomal Tristian fusions bilaterally.  Cardiovascular: Normal rate, regular rhythm. Normal S1 and S2.  Good peripheral circulation. Respiratory: Normal respiratory effort without tachypnea or retractions. Lungs CTAB. Good air entry to the bases with no decreased or absent breath sounds. Musculoskeletal: Full range of motion to all extremities. No gross deformities appreciated.  Visualization of bilateral elbows, wrists, hands reveal no gross findings.  No edema, deformity, ecchymosis, abrasions or lacerations.  Full range of motion to bilateral elbows, wrists, all digits bilateral hands.  Sensation intact in all dermatomal distributions bilateral upper extremities.  Negative Tinel's left side.  Positive Tinel's right side.  Negative Phalen's. Neurologic:  Normal speech and language. No gross focal neurologic deficits are appreciated.  Skin:  Skin is warm, dry and intact. No rash noted. Psychiatric: Mood and affect are normal. Speech and behavior are normal. Patient exhibits appropriate insight and judgement.  Patient denies any suicidal homicidal ideations.  Endorsing generalized anxiety regarding COVID-19 pandemic, economic situation, oral events.  Denies  depression or mania at this time.   ____________________________________________   LABS (all labs ordered are listed, but only abnormal results are displayed)  Labs Reviewed - No data to display ____________________________________________  EKG   ____________________________________________  RADIOLOGY   No results found.  ____________________________________________    PROCEDURES  Procedure(s) performed:    .Splint Application  Date/Time: 06/06/2019 4:17 PM Performed by: Racheal Patchesuthriell, Karder Goodin D, PA-C Authorized by: Racheal Patchesuthriell, Laia Wiley D, PA-C   Consent:    Consent obtained:  Verbal   Consent given by:  Patient Pre-procedure details:    Sensation:  Normal Procedure details:    Side: Bilateral.   Location:  Wrist   Splint type:  Wrist   Supplies:  Prefabricated splint Post-procedure details:    Pain:  Unchanged   Sensation:  Normal   Patient tolerance of procedure:  Tolerated well, no immediate complications      Medications - No data to display   ____________________________________________   INITIAL IMPRESSION / ASSESSMENT  AND PLAN / ED COURSE  Pertinent labs & imaging results that were available during my care of the patient were reviewed by me and considered in my medical decision making (see chart for details).  Review of the [place] CSRS was performed in accordance of the [facility] prior to dispensing any controlled drugs.           Patient's diagnosis is consistent with carpal tunnel syndrome, anxiety.  Patient presented to the emergency department with 2 complaints.  Patient's primary complaint is numbness and tingling and bilateral hands and fingers.  Patient is neurologically intact bilateral upper extremities.  Patient does have positive Tinel's on the right side and does have a history of carpal tunnel syndrome.  I suspect that symptoms are same.  Differential included cervical radiculopathy, carpal tunnel syndrome, median or ulnar nerve  compression, decreased circulation.  Patient will be placed in bilateral Velcro wrist splints, meloxicam for symptom improvement.  Patient is having generalized anxiety at this time.  She does have a history of anxiety, bipolar disorder, PTSD.  Patient reports in the past she has had thoughts that the world would be better without her, but she has never had any suicidal ideations.  No suicidal or homicidal ideations at this time.  Patient reports that she knows that she can see her psychiatrist or the emergency department if her symptoms worsen.  Patient is on no medication for anxiety, bipolar disorder or PTSD.  She is requesting medications for anxiety given COVID-19 pandemic, economic situation, will events.  At this time, exam was reassuring.  Patient will be prescribed Paxil until she can see her psychiatrist.  Patient will return to the emergency department if any symptoms change is, she feels worse, depressed, suicidal homicidal ideations.  Follow-up with orthopedics if symptoms do not improve from her carpal tunnel, follow-up with psychiatry as discussed, follow-up with primary care as needed.  Patient is given ED precautions to return to the ED for any worsening or new symptoms.     ____________________________________________  FINAL CLINICAL IMPRESSION(S) / ED DIAGNOSES  Final diagnoses:  GAD (generalized anxiety disorder)  Bilateral carpal tunnel syndrome      NEW MEDICATIONS STARTED DURING THIS VISIT:  ED Discharge Orders         Ordered    meloxicam (MOBIC) 15 MG tablet  Daily     06/06/19 1617    PARoxetine (PAXIL) 10 MG tablet  Daily     06/06/19 1617              This chart was dictated using voice recognition software/Dragon. Despite best efforts to proofread, errors can occur which can change the meaning. Any change was purely unintentional.    Racheal PatchesCuthriell, Daneka Lantigua D, PA-C 06/06/19 1618    Concha SeFunke, Mary E, MD 06/07/19 [phone]

## 2019-08-26 ENCOUNTER — Emergency Department
Admission: EM | Admit: 2019-08-26 | Discharge: 2019-08-26 | Disposition: A | Discharge status: Home / Self Care | Payer: Medicaid Other | Attending: Emergency Medicine | Admitting: Emergency Medicine

## 2019-08-26 ENCOUNTER — Encounter: Payer: Self-pay | Admitting: Emergency Medicine

## 2019-08-26 ENCOUNTER — Other Ambulatory Visit: Payer: Self-pay

## 2019-08-26 DIAGNOSIS — Z5321 Procedure and treatment not carried out due to patient leaving prior to being seen by health care provider: Secondary | ICD-10-CM | POA: Insufficient documentation

## 2019-08-26 DIAGNOSIS — F419 Anxiety disorder, unspecified: Secondary | ICD-10-CM | POA: Diagnosis present

## 2019-08-26 NOTE — ED Triage Notes (Signed)
Patient states for the last month she has had trouble sleeping. Would like something to help her sleep. Also reports intermittent anxiety.

## 2020-06-20 ENCOUNTER — Other Ambulatory Visit (HOSPITAL_COMMUNITY): Payer: Self-pay | Admitting: Family Medicine

## 2020-06-20 DIAGNOSIS — Z1231 Encounter for screening mammogram for malignant neoplasm of breast: Secondary | ICD-10-CM

## 2020-12-11 DIAGNOSIS — U071 COVID-19: Secondary | ICD-10-CM | POA: Insufficient documentation

## 2020-12-11 DIAGNOSIS — R000 Tachycardia, unspecified: Secondary | ICD-10-CM | POA: Insufficient documentation

## 2020-12-11 DIAGNOSIS — R079 Chest pain, unspecified: Secondary | ICD-10-CM | POA: Diagnosis present

## 2020-12-12 ENCOUNTER — Emergency Department
Admission: EM | Admit: 2020-12-12 | Discharge: 2020-12-12 | Disposition: A | Discharge status: Home / Self Care | Payer: Medicaid Other | Attending: Emergency Medicine | Admitting: Emergency Medicine

## 2020-12-12 ENCOUNTER — Other Ambulatory Visit: Payer: Self-pay

## 2020-12-12 ENCOUNTER — Emergency Department: Payer: Medicaid Other

## 2020-12-12 ENCOUNTER — Encounter: Payer: Self-pay | Admitting: Emergency Medicine

## 2020-12-12 DIAGNOSIS — R0789 Other chest pain: Secondary | ICD-10-CM

## 2020-12-12 DIAGNOSIS — U071 COVID-19: Secondary | ICD-10-CM

## 2020-12-12 LAB — CBC WITH DIFFERENTIAL/PLATELET
Abs Immature Granulocytes: 0.03 10*3/uL (ref 0.00–0.07)
Basophils Absolute: 0 10*3/uL (ref 0.0–0.1)
Basophils Relative: 1 %
Eosinophils Absolute: 0.1 10*3/uL (ref 0.0–0.5)
Eosinophils Relative: 2 %
HCT: 38.8 % (ref 36.0–46.0)
Hemoglobin: 13 g/dL (ref 12.0–15.0)
Immature Granulocytes: 0 %
Lymphocytes Relative: 47 %
Lymphs Abs: 3.6 10*3/uL (ref 0.7–4.0)
MCH: 29.8 pg (ref 26.0–34.0)
MCHC: 33.5 g/dL (ref 30.0–36.0)
MCV: 89 fL (ref 80.0–100.0)
Monocytes Absolute: 1 10*3/uL (ref 0.1–1.0)
Monocytes Relative: 14 %
Neutro Abs: 2.8 10*3/uL (ref 1.7–7.7)
Neutrophils Relative %: 36 %
Platelets: 440 10*3/uL — ABNORMAL HIGH (ref 150–400)
RBC: 4.36 MIL/uL (ref 3.87–5.11)
RDW: 12.7 % (ref 11.5–15.5)
WBC: 7.6 10*3/uL (ref 4.0–10.5)
nRBC: 0 % (ref 0.0–0.2)

## 2020-12-12 LAB — COMPREHENSIVE METABOLIC PANEL
ALT: 14 U/L (ref 0–44)
AST: 24 U/L (ref 15–41)
Albumin: 4 g/dL (ref 3.5–5.0)
Alkaline Phosphatase: 50 U/L (ref 38–126)
Anion gap: 13 (ref 5–15)
BUN: 13 mg/dL (ref 6–20)
CO2: 23 mmol/L (ref 22–32)
Calcium: 9.5 mg/dL (ref 8.9–10.3)
Chloride: 101 mmol/L (ref 98–111)
Creatinine, Ser: 0.72 mg/dL (ref 0.44–1.00)
GFR, Estimated: >60 mL/min (ref >60)
Glucose, Bld: 172 mg/dL — ABNORMAL HIGH (ref 70–99)
Potassium: 4.2 mmol/L (ref 3.5–5.1)
Sodium: 137 mmol/L (ref 135–145)
Total Bilirubin: 0.6 mg/dL (ref 0.3–1.2)
Total Protein: 7.6 g/dL (ref 6.5–8.1)

## 2020-12-12 NOTE — ED Provider Notes (Signed)
[facility] Emergency Department Provider Note ____________________________________________   Event Date/Time   First MD Initiated Contact with Patient 12/12/20 0217     (approximate)  I have reviewed the triage vital signs and the nursing notes.  HISTORY  Chief Complaint Chest Pain   HPI Helen Lopez is a 50 y.o. femalewho presents to the ED for evaluation of chest pain.  Chart review indicates history of bipolar disorder.  Tested positive for COVID-19 on 1/12 and was briefly hospitalized at [facility].  Patient reports being at home for the past week with supplemental oxygen.  Per presents to the ED requesting a chest x-ray for evaluation of pneumonia and due to left-sided flank pain.  Patient reports doing some cleaning around the house and lifting, subsequently having left-sided chest wall pain.  Denies worsening of her shortness of breath, denies productive cough, denies recurrence of fevers or syncopal episodes.  We discussed the possibility of cardiac pathology or PE/VTE as the source of her symptoms.  Patient reports that she does not want any cardiac testing, refusing EKG and blood work related to cardiac pathology.  She again reports that she only wants a chest x-ray to evaluate for pneumonia.   Past Medical History:  Diagnosis Date   Anxiety    Bipolar 1 disorder ([facility])    PTSD (post-traumatic stress disorder)     There are no problems to display for this patient.   Past Surgical History:  Procedure Laterality Date   TONSILLECTOMY      Prior to Admission medications   Medication Sig Start Date End Date Taking? Authorizing Provider  meloxicam (MOBIC) 15 MG tablet Take 1 tablet (15 mg total) by mouth daily. 06/06/19   Cuthriell, Delorise Royals, PA-C  PARoxetine (PAXIL) 10 MG tablet Take 1 tablet (10 mg total) by mouth daily. 06/06/19 06/05/20  Cuthriell, Delorise Royals, PA-C    Allergies Patient has no known allergies.  No  family history on file.  Social History Social History   Tobacco Use   Smoking status: Never Smoker   Smokeless tobacco: Never Used  Substance Use Topics   Alcohol use: Yes    Alcohol/week: 2.0 standard drinks    Types: 2 Glasses of wine per week   Drug use: Yes    Types: Marijuana    Comment: last used yesterday    Review of Systems  Constitutional: No fever/chills Eyes: No visual changes. ENT: No sore throat. Cardiovascular: Denies chest pain. Respiratory: Positive for dyspnea on exertion.  Negative for productive cough Gastrointestinal: No abdominal pain.  No nausea, no vomiting.  No diarrhea.  No constipation. Genitourinary: Negative for dysuria. Musculoskeletal: Negative for back pain.  Positive for left-sided flank pain. Skin: Negative for rash. Neurological: Negative for headaches, focal weakness or numbness.  ____________________________________________   PHYSICAL EXAM:  VITAL SIGNS: Vitals:   12/11/20 2349  BP: 136/82  Pulse: (!) 103  Resp: 18  Temp: 97.9 F (36.6 C)  SpO2: 96%      Constitutional: Alert and oriented. Well appearing and in no acute distress.  Conversational in full sentences. Eyes: Conjunctivae are normal. PERRL. EOMI. Head: Atraumatic. Nose: No congestion/rhinnorhea. Mouth/Throat: Mucous membranes are moist.  Oropharynx non-erythematous. Neck: No stridor. No cervical spine tenderness to palpation. Cardiovascular: Tachycardic rate, regular rhythm. Grossly normal heart sounds.  Good peripheral circulation. Respiratory: Normal respiratory effort.  No retractions. Lungs CTAB. Gastrointestinal: Soft , nondistended, nontender to palpation. No CVA tenderness. Musculoskeletal: No lower extremity tenderness nor edema.  No joint effusions. No signs of acute trauma. Neurologic:  Normal speech and language. No gross focal neurologic deficits are appreciated. No gait instability noted. Skin:  Skin is warm, dry and intact. No rash  noted. Psychiatric: Mood and affect are normal. Speech and behavior are normal.  ____________________________________________   LABS (all labs ordered are listed, but only abnormal results are displayed)  Labs Reviewed  CBC WITH DIFFERENTIAL/PLATELET - Abnormal; Notable for the following components:      Result Value   Platelets 440 (*)    All other components within normal limits  COMPREHENSIVE METABOLIC PANEL - Abnormal; Notable for the following components:   Glucose, Bld 172 (*)    All other components within normal limits   ____________________________________________  RADIOLOGY  ED MD interpretation: 2 view CXR reviewed by me with patchy bibasilar infiltrates without discrete lobar filtration  Official radiology report(s): DG Chest 2 View  Result Date: 12/12/2020 CLINICAL DATA:  Right-sided chest pain.  COVID positive. EXAM: CHEST - 2 VIEW COMPARISON:  05/18/2013 FINDINGS: Slightly shallow inspiration. Heart size is normal. Streaky linear opacities in the lung bases compatible with early multifocal pneumonia. No pleural effusions. No pneumothorax. Mediastinal contours appear intact. IMPRESSION: Streaky linear opacities in the lung bases compatible with early multifocal pneumonia. Electronically Signed   By: Burman Nieves M.D.   On: 12/12/2020 00:50    ____________________________________________   PROCEDURES and INTERVENTIONS  Procedure(s) performed (including Critical Care):  .1-3 Lead EKG Interpretation Performed by: Delton Prairie, MD Authorized by: Delton Prairie, MD     Interpretation: abnormal     ECG rate:  103   ECG rate assessment: tachycardic     Rhythm: sinus tachycardia     Ectopy: none     Conduction: normal      Medications - No data to display  ____________________________________________   MDM / ED COURSE   50 year old woman with recent diagnosis of COVID-19 presents to the ED with left-sided chest pain, refusing cardiac work-up, and  ultimately amenable to outpatient management.  Sinus tachycardia, otherwise normal vital signs on room air.  Exam is reassuring without evidence of acute distress, neurovascular deficits, trauma.  She is conversational in full sentences without respiratory difficulty.  Basic labs unremarkable.  CXR without discrete lobar infiltration, rib fractures or PTX.  She refuses cardiac work-up and we discussed the possibility of VTE/PE as well, but she continues to refuse work-up for this.  She is satisfied with chest x-ray and is subsequently requesting discharge.  We discussed return precautions for the ED prior to her leaving.      ____________________________________________   FINAL CLINICAL IMPRESSION(S) / ED DIAGNOSES  Final diagnoses:  Other chest pain  COVID-19     ED Discharge Orders    None       Wynn Alldredge   Note:  This document was prepared using Dragon voice recognition software and may include unintentional dictation errors.   Delton Prairie, MD 12/12/20 [phone]

## 2020-12-12 NOTE — Discharge Instructions (Signed)
Please take Tylenol and ibuprofen/Advil for your pain.  It is safe to take them together, or to alternate them every few hours.  Take up to 1000mg  of Tylenol at a time, up to 4 times per day.  Do not take more than 4000 mg of Tylenol in 24 hours.  For ibuprofen, take 400-600 mg, 4-5 times per day.  If you develop any worsening chest pain, particularly with high fevers and productive cough of sputum/mucus, please return to the ED. If you develop any worsening chest pains with passing out all the way or worsening breathing, please return to the ED

## 2020-12-12 NOTE — ED Triage Notes (Addendum)
Patient ambulatory to triage with steady gait, without difficulty or distress noted; 1/12 +COVID and hosp for pneumonia; pt c/o rt sided CP only with deep breathing; denies any other c/o at this time; uses O2 at 2l/min via [place] at all times; pt refuses EKG or card workup, st "only want an xray to see if I still have pneumonia"

## 2020-12-12 NOTE — ED Notes (Signed)
Pt states coming in after testing negative for covid-19, but that she did have covid a few weeks ago. Pt states left sided chest pain that started after doing things around the house yesterday.

## 2022-05-26 ENCOUNTER — Encounter (HOSPITAL_COMMUNITY): Payer: Self-pay

## 2022-05-26 ENCOUNTER — Emergency Department (HOSPITAL_COMMUNITY)
Admission: EM | Admit: 2022-05-26 | Discharge: 2022-05-27 | Discharge status: Against Medical Advice | Payer: Medicaid Other | Attending: Physician Assistant | Admitting: Physician Assistant

## 2022-05-26 ENCOUNTER — Other Ambulatory Visit: Payer: Self-pay

## 2022-05-26 ENCOUNTER — Emergency Department (HOSPITAL_COMMUNITY): Payer: Medicaid Other

## 2022-05-26 DIAGNOSIS — Z5321 Procedure and treatment not carried out due to patient leaving prior to being seen by health care provider: Secondary | ICD-10-CM | POA: Diagnosis not present

## 2022-05-26 DIAGNOSIS — Z20822 Contact with and (suspected) exposure to covid-19: Secondary | ICD-10-CM | POA: Diagnosis not present

## 2022-05-26 DIAGNOSIS — R079 Chest pain, unspecified: Secondary | ICD-10-CM | POA: Insufficient documentation

## 2022-05-26 DIAGNOSIS — J029 Acute pharyngitis, unspecified: Secondary | ICD-10-CM | POA: Insufficient documentation

## 2022-05-26 DIAGNOSIS — R059 Cough, unspecified: Secondary | ICD-10-CM | POA: Diagnosis present

## 2022-05-26 HISTORY — DX: Pneumonia, unspecified organism: J18.9

## 2022-05-26 HISTORY — DX: COVID-19: U07.1

## 2022-05-26 LAB — RESP PANEL BY RT-PCR (FLU A&B, COVID) ARPGX2
Influenza A by PCR: NEGATIVE
Influenza B by PCR: NEGATIVE
SARS Coronavirus 2 by RT PCR: NEGATIVE

## 2022-05-26 NOTE — ED Provider Triage Note (Signed)
Emergency Medicine Provider Triage Evaluation Note  Helen Lopez , a 51 y.o. female  was evaluated in triage.  Pt complains of  three weeks of cough with productive green sputum, body aches, and sore throat.  She was getting better but got worse about 4 days ago.   She had a temp of 103 four days ago.    Physical Exam  BP 104/71 (BP Location: Left Arm)   Pulse 75   Temp 98.1 F (36.7 C) (Oral)   Resp 16   Ht 5\' 3"  (1.6 m)   Wt 72.6 kg   SpO2 98%   BMI 28.34 kg/m  Gen:   Awake, no distress   Resp:  Normal effort  MSK:   Moves extremities without difficulty  Other:  Normal speech  Medical Decision Making  Medically screening exam initiated at 4:02 PM.  Appropriate orders placed.  Helen Lopez was informed that the remainder of the evaluation will be completed by another provider, this initial triage assessment does not replace that evaluation, and the importance of remaining in the ED until their evaluation is complete.  Suspicious for double sickening.  Will check cxr and covid/flu.    , Cristina Gong 05/26/22 1607

## 2022-05-26 NOTE — ED Triage Notes (Signed)
Pt reports productive cough of green phlegm, sore throat, body aches and chest pain when she coughs, hx of pneumonia and Covid. She reports she gets PNa every year.

## 2022-05-26 NOTE — ED Notes (Signed)
No response for vitals  

## 2023-09-15 ENCOUNTER — Other Ambulatory Visit: Payer: Self-pay
